# Patient Record
Sex: Male | Born: 1975 | Race: White | Hispanic: No | Marital: Single | Smoking: Former smoker
Health system: Southern US, Community
[De-identification: ages and names within clinical notes are randomized; demographics above are authoritative.]

## PROBLEM LIST (undated history)

## (undated) DIAGNOSIS — IMO0001 Reserved for inherently not codable concepts without codable children: Secondary | ICD-10-CM

## (undated) DIAGNOSIS — S0990XA Unspecified injury of head, initial encounter: Secondary | ICD-10-CM

## (undated) DIAGNOSIS — K219 Gastro-esophageal reflux disease without esophagitis: Secondary | ICD-10-CM

## (undated) DIAGNOSIS — G44309 Post-traumatic headache, unspecified, not intractable: Secondary | ICD-10-CM

## (undated) DIAGNOSIS — S0990XS Unspecified injury of head, sequela: Secondary | ICD-10-CM

---

## 2005-08-03 ENCOUNTER — Emergency Department (HOSPITAL_COMMUNITY): Admission: EM | Admit: 2005-08-03 | Discharge: 2005-08-03 | Payer: Self-pay | Admitting: Emergency Medicine

## 2012-02-11 DIAGNOSIS — S0990XA Unspecified injury of head, initial encounter: Secondary | ICD-10-CM

## 2012-02-11 HISTORY — DX: Unspecified injury of head, initial encounter: S09.90XA

## 2012-02-18 ENCOUNTER — Emergency Department (HOSPITAL_COMMUNITY)
Admission: EM | Admit: 2012-02-18 | Discharge: 2012-02-18 | Discharge status: Against Medical Advice | Payer: Self-pay | Attending: Emergency Medicine | Admitting: Emergency Medicine

## 2012-02-18 ENCOUNTER — Ambulatory Visit (HOSPITAL_COMMUNITY): Admission: RE | Admit: 2012-02-18 | Payer: Self-pay | Source: Ambulatory Visit

## 2012-02-18 ENCOUNTER — Encounter (HOSPITAL_COMMUNITY): Payer: Self-pay | Admitting: *Deleted

## 2012-02-18 DIAGNOSIS — M25519 Pain in unspecified shoulder: Secondary | ICD-10-CM | POA: Insufficient documentation
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
## 2012-02-18 NOTE — ED Notes (Signed)
Patient not in room at this time, c-collar removed by patient and on bed, patient is not able to be located in the ED at this time, Provider made aware.
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
## 2012-02-25 ENCOUNTER — Emergency Department (HOSPITAL_BASED_OUTPATIENT_CLINIC_OR_DEPARTMENT_OTHER)
Admission: EM | Admit: 2012-02-25 | Discharge: 2012-02-25 | Disposition: A | Discharge status: Home / Self Care | Payer: No Typology Code available for payment source | Attending: Emergency Medicine | Admitting: Emergency Medicine

## 2012-02-25 ENCOUNTER — Encounter (HOSPITAL_BASED_OUTPATIENT_CLINIC_OR_DEPARTMENT_OTHER): Payer: Self-pay

## 2012-02-25 ENCOUNTER — Emergency Department (HOSPITAL_BASED_OUTPATIENT_CLINIC_OR_DEPARTMENT_OTHER): Payer: Self-pay

## 2012-02-25 DIAGNOSIS — S060X9A Concussion with loss of consciousness of unspecified duration, initial encounter: Secondary | ICD-10-CM | POA: Insufficient documentation

## 2012-02-25 DIAGNOSIS — R5381 Other malaise: Secondary | ICD-10-CM | POA: Insufficient documentation

## 2012-02-25 DIAGNOSIS — R51 Headache: Secondary | ICD-10-CM | POA: Insufficient documentation

## 2012-02-25 DIAGNOSIS — S060XAA Concussion with loss of consciousness status unknown, initial encounter: Secondary | ICD-10-CM | POA: Insufficient documentation
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
## 2012-08-14 ENCOUNTER — Emergency Department (HOSPITAL_BASED_OUTPATIENT_CLINIC_OR_DEPARTMENT_OTHER): Payer: Self-pay

## 2012-08-14 ENCOUNTER — Emergency Department (HOSPITAL_BASED_OUTPATIENT_CLINIC_OR_DEPARTMENT_OTHER)
Admission: EM | Admit: 2012-08-14 | Discharge: 2012-08-14 | Disposition: A | Discharge status: Home / Self Care | Payer: Self-pay | Attending: Emergency Medicine | Admitting: Emergency Medicine

## 2012-08-14 ENCOUNTER — Encounter (HOSPITAL_BASED_OUTPATIENT_CLINIC_OR_DEPARTMENT_OTHER): Payer: Self-pay | Admitting: *Deleted

## 2012-08-14 DIAGNOSIS — R202 Paresthesia of skin: Secondary | ICD-10-CM

## 2012-08-14 DIAGNOSIS — Z87828 Personal history of other (healed) physical injury and trauma: Secondary | ICD-10-CM | POA: Insufficient documentation

## 2012-08-14 DIAGNOSIS — Z79899 Other long term (current) drug therapy: Secondary | ICD-10-CM | POA: Insufficient documentation

## 2012-08-14 DIAGNOSIS — K219 Gastro-esophageal reflux disease without esophagitis: Secondary | ICD-10-CM | POA: Insufficient documentation

## 2012-08-14 DIAGNOSIS — Z87891 Personal history of nicotine dependence: Secondary | ICD-10-CM | POA: Insufficient documentation

## 2012-08-14 DIAGNOSIS — R209 Unspecified disturbances of skin sensation: Secondary | ICD-10-CM | POA: Insufficient documentation

## 2012-08-14 HISTORY — DX: Unspecified injury of head, initial encounter: S09.90XA

## 2012-08-14 HISTORY — DX: Post-traumatic headache, unspecified, not intractable: G44.309

## 2012-08-14 HISTORY — DX: Post-traumatic headache, unspecified, not intractable: S09.90XS

## 2012-08-14 HISTORY — DX: Reserved for inherently not codable concepts without codable children: IMO0001

## 2012-08-14 HISTORY — DX: Gastro-esophageal reflux disease without esophagitis: K21.9

## 2012-08-14 LAB — URINALYSIS, ROUTINE W REFLEX MICROSCOPIC
Bilirubin Urine: NEGATIVE
Hgb urine dipstick: NEGATIVE
Protein, ur: NEGATIVE mg/dL
Specific Gravity, Urine: 1.022 (ref 1.005–1.030)
pH: 5 (ref 5.0–8.0)

## 2012-08-14 LAB — COMPREHENSIVE METABOLIC PANEL
Albumin: 3.9 g/dL (ref 3.5–5.2)
BUN: 13 mg/dL (ref 6–23)
CO2: 25 mEq/L (ref 19–32)
Calcium: 9.1 mg/dL (ref 8.4–10.5)
Creatinine, Ser: 0.8 mg/dL (ref 0.50–1.35)
Total Bilirubin: 0.2 mg/dL — ABNORMAL LOW (ref 0.3–1.2)
Total Protein: 7.6 g/dL (ref 6.0–8.3)

## 2012-08-14 LAB — CBC WITH DIFFERENTIAL/PLATELET
Eosinophils Relative: 3 % (ref 0–5)
Lymphs Abs: 2.4 10*3/uL (ref 0.7–4.0)
MCV: 85.8 fL (ref 78.0–100.0)
Monocytes Absolute: 0.7 10*3/uL (ref 0.1–1.0)
Neutro Abs: 7.5 10*3/uL (ref 1.7–7.7)
Neutrophils Relative %: 69 % (ref 43–77)

## 2012-08-14 NOTE — ED Provider Notes (Signed)
History/physical exam/procedure(s) were performed by non-physician practitioner and as supervising physician I was immediately available for consultation/collaboration. I have reviewed all notes and am in agreement with care and plan.   Hilario Quarry, MD 08/14/12 [phone]
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
## 2014-01-14 ENCOUNTER — Emergency Department (HOSPITAL_BASED_OUTPATIENT_CLINIC_OR_DEPARTMENT_OTHER): Payer: Self-pay

## 2014-01-14 ENCOUNTER — Emergency Department (HOSPITAL_BASED_OUTPATIENT_CLINIC_OR_DEPARTMENT_OTHER)
Admission: EM | Admit: 2014-01-14 | Discharge: 2014-01-15 | Disposition: A | Discharge status: Home / Self Care | Payer: Self-pay | Attending: Emergency Medicine | Admitting: Emergency Medicine

## 2014-01-14 ENCOUNTER — Encounter (HOSPITAL_BASED_OUTPATIENT_CLINIC_OR_DEPARTMENT_OTHER): Payer: Self-pay | Admitting: Emergency Medicine

## 2014-01-14 DIAGNOSIS — M79671 Pain in right foot: Secondary | ICD-10-CM

## 2014-01-14 DIAGNOSIS — Z8719 Personal history of other diseases of the digestive system: Secondary | ICD-10-CM | POA: Insufficient documentation

## 2014-01-14 DIAGNOSIS — M25579 Pain in unspecified ankle and joints of unspecified foot: Secondary | ICD-10-CM | POA: Insufficient documentation

## 2014-01-14 DIAGNOSIS — Z87828 Personal history of other (healed) physical injury and trauma: Secondary | ICD-10-CM | POA: Insufficient documentation

## 2014-01-14 DIAGNOSIS — G8921 Chronic pain due to trauma: Secondary | ICD-10-CM | POA: Insufficient documentation

## 2014-01-14 DIAGNOSIS — Z87891 Personal history of nicotine dependence: Secondary | ICD-10-CM | POA: Insufficient documentation

## 2014-01-14 DIAGNOSIS — Z880 Allergy status to penicillin: Secondary | ICD-10-CM | POA: Insufficient documentation

## 2014-01-14 DIAGNOSIS — G8929 Other chronic pain: Secondary | ICD-10-CM
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
## 2014-01-14 NOTE — ED Notes (Signed)
Patient transported to X-ray 

## 2014-01-15 MED ORDER — NAPROXEN 250 MG PO TABS
500.0000 mg | ORAL_TABLET | Freq: Once | ORAL | Status: AC
  Administered 2014-01-15: 500 mg via ORAL
  Filled 2014-01-15: qty 2

## 2014-01-15 MED ORDER — NAPROXEN 500 MG PO TABS: 500.0000 mg | ORAL_TABLET | Freq: Two times a day (BID) | ORAL | Status: AC

## 2014-01-15 NOTE — Discharge Instructions (Signed)
Your x-rays did not show the reason for your ongoing heel pain. Please follow up with the orthopedic surgeon for further evaluation. In the meantime, take naproxen twice a day. As an alternative to the prescription naproxen, you can take 2 Aleve tablets at a time, twice a day. Where the ankle splint as needed.  Naproxen and naproxen sodium oral immediate-release tablets What is this medicine? NAPROXEN (na PROX en) is a non-steroidal anti-inflammatory drug (NSAID). It is used to reduce swelling and to treat pain. This medicine may be used for dental pain, headache, or painful monthly periods. It is also used for painful joint and muscular problems such as arthritis, tendinitis, bursitis, and gout. This medicine may be used for other purposes; ask your health care provider or pharmacist if you have questions. COMMON BRAND NAME(S): Aflaxen, Aleve Arthritis, Aleve, All Day Relief, Anaprox DS, Anaprox, Naprosyn What should I tell my health care provider before I take this medicine? They need to know if you have any of these conditions: -asthma -cigarette smoker -drink more than 3 alcohol containing drinks a day -heart disease or circulation problems such as heart failure or leg edema (fluid retention) -high blood pressure -kidney disease -liver disease -stomach bleeding or ulcers -an unusual or allergic reaction to naproxen, aspirin, other NSAIDs, other medicines, foods, dyes, or preservatives -pregnant or trying to get pregnant -breast-feeding How should I use this medicine? Take this medicine by mouth with a glass of water. Follow the directions on the prescription label. Take it with food if your stomach gets upset. Try to not lie down for at least 10 minutes after you take it. Take your medicine at regular intervals. Do not take your medicine more often than directed. Long-term, continuous use may increase the risk of heart attack or stroke. A special MedGuide will be given to you by the  pharmacist with each prescription and refill. Be sure to read this information carefully each time. Talk to your pediatrician regarding the use of this medicine in children. Special care may be needed. Overdosage: If you think you have taken too much of this medicine contact a poison control center or emergency room at once. NOTE: This medicine is only for you. Do not share this medicine with others. What if I miss a dose? If you miss a dose, take it as soon as you can. If it is almost time for your next dose, take only that dose. Do not take double or extra doses. What may interact with this medicine? -alcohol -aspirin -cidofovir -diuretics -lithium -methotrexate -other drugs for inflammation like ketorolac or prednisone -pemetrexed -probenecid -warfarin This list may not describe all possible interactions. Give your health care provider a list of all the medicines, herbs, non-prescription drugs, or dietary supplements you use. Also tell them if you smoke, drink alcohol, or use illegal drugs. Some items may interact with your medicine. What should I watch for while using this medicine? Tell your doctor or health care professional if your pain does not get better. Talk to your doctor before taking another medicine for pain. Do not treat yourself. This medicine does not prevent heart attack or stroke. In fact, this medicine may increase the chance of a heart attack or stroke. The chance may increase with longer use of this medicine and in people who have heart disease. If you take aspirin to prevent heart attack or stroke, talk with your doctor or health care professional. Do not take other medicines that contain aspirin, ibuprofen, or naproxen with  this medicine. Side effects such as stomach upset, nausea, or ulcers may be more likely to occur. Many medicines available without a prescription should not be taken with this medicine. This medicine can cause ulcers and bleeding in the stomach and  intestines at any time during treatment. Do not smoke cigarettes or drink alcohol. These increase irritation to your stomach and can make it more susceptible to damage from this medicine. Ulcers and bleeding can happen without warning symptoms and can cause death. You may get drowsy or dizzy. Do not drive, use machinery, or do anything that needs mental alertness until you know how this medicine affects you. Do not stand or sit up quickly, especially if you are an older patient. This reduces the risk of dizzy or fainting spells. This medicine can cause you to bleed more easily. Try to avoid damage to your teeth and gums when you brush or floss your teeth. What side effects may I notice from receiving this medicine? Side effects that you should report to your doctor or health care professional as soon as possible: -black or bloody stools, blood in the urine or vomit -blurred vision -chest pain -difficulty breathing or wheezing -nausea or vomiting -severe stomach pain -skin rash, skin redness, blistering or peeling skin, hives, or itching -slurred speech or weakness on one side of the body -swelling of eyelids, throat, lips -unexplained weight gain or swelling -unusually weak or tired -yellowing of eyes or skin Side effects that usually do not require medical attention (report to your doctor or health care professional if they continue or are bothersome): -constipation -headache -heartburn This list may not describe all possible side effects. Call your doctor for medical advice about side effects. You may report side effects to FDA at 1-800-FDA-1088. Where should I keep my medicine? Keep out of the reach of children. Store at room temperature between 15 and 30 degrees C (59 and 86 degrees F). Keep container tightly closed. Throw away any unused medicine after the expiration date. NOTE: This sheet is a summary. It may not cover all possible information. If you have questions about this  medicine, talk to your doctor, pharmacist, or health care provider.  2014, Elsevier/Gold Standard. (2009-08-01 20:10:16)

## 2014-04-20 ENCOUNTER — Ambulatory Visit (INDEPENDENT_AMBULATORY_CARE_PROVIDER_SITE_OTHER): Payer: Self-pay | Admitting: Family Medicine

## 2014-04-20 VITALS — BP 140/84 | HR 66 | Temp 98.4°F | Resp 16 | Ht 69.5 in | Wt 368.8 lb

## 2014-04-20 DIAGNOSIS — Z0289 Encounter for other administrative examinations: Secondary | ICD-10-CM

## 2014-04-20 DIAGNOSIS — Z87820 Personal history of traumatic brain injury: Secondary | ICD-10-CM
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
## 2015-04-16 ENCOUNTER — Emergency Department (HOSPITAL_BASED_OUTPATIENT_CLINIC_OR_DEPARTMENT_OTHER): Payer: Self-pay

## 2015-04-16 ENCOUNTER — Emergency Department (HOSPITAL_BASED_OUTPATIENT_CLINIC_OR_DEPARTMENT_OTHER)
Admission: EM | Admit: 2015-04-16 | Discharge: 2015-04-16 | Disposition: A | Discharge status: Home / Self Care | Payer: Self-pay | Attending: Emergency Medicine | Admitting: Emergency Medicine

## 2015-04-16 ENCOUNTER — Encounter (HOSPITAL_BASED_OUTPATIENT_CLINIC_OR_DEPARTMENT_OTHER): Payer: Self-pay | Admitting: Emergency Medicine

## 2015-04-16 DIAGNOSIS — M546 Pain in thoracic spine: Secondary | ICD-10-CM | POA: Insufficient documentation

## 2015-04-16 DIAGNOSIS — R110 Nausea: Secondary | ICD-10-CM | POA: Insufficient documentation

## 2015-04-16 DIAGNOSIS — R52 Pain, unspecified: Secondary | ICD-10-CM

## 2015-04-16 DIAGNOSIS — Z8719 Personal history of other diseases of the digestive system: Secondary | ICD-10-CM | POA: Insufficient documentation

## 2015-04-16 DIAGNOSIS — Z8669 Personal history of other diseases of the nervous system and sense organs: Secondary | ICD-10-CM | POA: Insufficient documentation

## 2015-04-16 DIAGNOSIS — Z87828 Personal history of other (healed) physical injury and trauma: Secondary | ICD-10-CM | POA: Insufficient documentation

## 2015-04-16 DIAGNOSIS — Z720 Tobacco use: Secondary | ICD-10-CM | POA: Insufficient documentation

## 2015-04-16 DIAGNOSIS — Z880 Allergy status to penicillin: Secondary | ICD-10-CM | POA: Insufficient documentation

## 2015-04-16 DIAGNOSIS — Z791 Long term (current) use of non-steroidal anti-inflammatories (NSAID): Secondary | ICD-10-CM | POA: Insufficient documentation

## 2015-04-16 LAB — URINALYSIS, ROUTINE W REFLEX MICROSCOPIC
BILIRUBIN URINE: NEGATIVE
Glucose, UA: NEGATIVE mg/dL
Hgb urine dipstick: NEGATIVE
KETONES UR: NEGATIVE mg/dL
Leukocytes, UA: NEGATIVE
NITRITE: NEGATIVE
PROTEIN: NEGATIVE mg/dL
SPECIFIC GRAVITY, URINE: 1.039 — AB (ref 1.005–1.030)
UROBILINOGEN UA: 0.2 mg/dL (ref 0.0–1.0)
pH: 5 (ref 5.0–8.0)

## 2015-04-16 MED ORDER — IBUPROFEN 800 MG PO TABS: 800.0000 mg | ORAL_TABLET | Freq: Three times a day (TID) | ORAL | Status: AC | PRN

## 2015-04-16 MED ORDER — KETOROLAC TROMETHAMINE 60 MG/2ML IM SOLN
60.0000 mg | Freq: Once | INTRAMUSCULAR | Status: AC
  Administered 2015-04-16: 60 mg via INTRAMUSCULAR
  Filled 2015-04-16: qty 2

## 2015-04-16 MED ORDER — HYDROCODONE-ACETAMINOPHEN 5-325 MG PO TABS: 1.0000 | ORAL_TABLET | ORAL | Status: AC | PRN

## 2015-04-16 MED ORDER — CYCLOBENZAPRINE HCL 10 MG PO TABS: 10.0000 mg | ORAL_TABLET | Freq: Three times a day (TID) | ORAL | Status: AC | PRN

## 2015-04-16 MED ORDER — ONDANSETRON 8 MG PO TBDP
8.0000 mg | ORAL_TABLET | Freq: Once | ORAL | Status: AC
  Administered 2015-04-16: 8 mg via ORAL
  Filled 2015-04-16: qty 1
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
## 2018-05-24 ENCOUNTER — Emergency Department (HOSPITAL_BASED_OUTPATIENT_CLINIC_OR_DEPARTMENT_OTHER): Payer: Self-pay

## 2018-05-24 ENCOUNTER — Other Ambulatory Visit: Payer: Self-pay

## 2018-05-24 ENCOUNTER — Emergency Department (HOSPITAL_BASED_OUTPATIENT_CLINIC_OR_DEPARTMENT_OTHER)
Admission: EM | Admit: 2018-05-24 | Discharge: 2018-05-25 | Disposition: A | Discharge status: Home / Self Care | Payer: Self-pay | Attending: Emergency Medicine | Admitting: Emergency Medicine

## 2018-05-24 ENCOUNTER — Encounter (HOSPITAL_BASED_OUTPATIENT_CLINIC_OR_DEPARTMENT_OTHER): Payer: Self-pay

## 2018-05-24 DIAGNOSIS — R5383 Other fatigue: Secondary | ICD-10-CM | POA: Insufficient documentation

## 2018-05-24 DIAGNOSIS — K648 Other hemorrhoids: Secondary | ICD-10-CM | POA: Insufficient documentation

## 2018-05-24 DIAGNOSIS — K625 Hemorrhage of anus and rectum: Secondary | ICD-10-CM

## 2018-05-24 DIAGNOSIS — Z87891 Personal history of nicotine dependence: Secondary | ICD-10-CM | POA: Insufficient documentation

## 2018-05-24 NOTE — ED Triage Notes (Addendum)
Pt c/o active rectal bleeding for "a few days" Pt reports passing blood clots.

## 2018-05-25 LAB — COMPREHENSIVE METABOLIC PANEL
ALBUMIN: 4.1 g/dL (ref 3.5–5.0)
ALK PHOS: 57 U/L (ref 38–126)
ALT: 16 U/L (ref 0–44)
ANION GAP: 7 (ref 5–15)
AST: 15 U/L (ref 15–41)
BUN: 18 mg/dL (ref 6–20)
CHLORIDE: 107 mmol/L (ref 98–111)
CO2: 26 mmol/L (ref 22–32)
Calcium: 8.9 mg/dL (ref 8.9–10.3)
Creatinine, Ser: 0.77 mg/dL (ref 0.61–1.24)
GFR calc Af Amer: >60 mL/min (ref >60)
GFR calc non Af Amer: >60 mL/min (ref >60)
Glucose, Bld: 100 mg/dL — ABNORMAL HIGH (ref 70–99)
POTASSIUM: 4.3 mmol/L (ref 3.5–5.1)
SODIUM: 140 mmol/L (ref 135–145)
Total Bilirubin: 0.3 mg/dL (ref 0.3–1.2)
Total Protein: 7.5 g/dL (ref 6.5–8.1)

## 2018-05-25 LAB — DIFFERENTIAL
BASOS ABS: 0.1 10*3/uL (ref 0.0–0.1)
BASOS PCT: 1 %
EOS ABS: 0.3 10*3/uL (ref 0.0–0.5)
Eosinophils Relative: 3 %
LYMPHS ABS: 3.2 10*3/uL (ref 0.7–4.0)
Lymphocytes Relative: 29 %
MONOS PCT: 7 %
Monocytes Absolute: 0.8 10*3/uL (ref 0.1–1.0)
NEUTROS ABS: 6.8 10*3/uL (ref 1.7–7.7)
NEUTROS PCT: 61 %

## 2018-05-25 LAB — CBC WITH DIFFERENTIAL/PLATELET
ABS IMMATURE GRANULOCYTES: 0.05 10*3/uL (ref 0.00–0.07)
BASOS ABS: 0.1 10*3/uL (ref 0.0–0.1)
BASOS PCT: 1 %
EOS ABS: 0.3 10*3/uL (ref 0.0–0.5)
Eosinophils Relative: 3 %
HCT: 45.2 % (ref 39.0–52.0)
Hemoglobin: 15.3 g/dL (ref 13.0–17.0)
IMMATURE GRANULOCYTES: 1 %
LYMPHS ABS: 2.9 10*3/uL (ref 0.7–4.0)
Lymphocytes Relative: 29 %
MCH: 30 pg (ref 26.0–34.0)
MCHC: 33.8 g/dL (ref 30.0–36.0)
MCV: 88.6 fL (ref 80.0–100.0)
Monocytes Absolute: 0.7 10*3/uL (ref 0.1–1.0)
Monocytes Relative: 7 %
NEUTROS ABS: 6 10*3/uL (ref 1.7–7.7)
NEUTROS PCT: 59 %
NRBC: 0 % (ref 0.0–0.2)
PLATELETS: 300 10*3/uL (ref 150–400)
RBC: 5.1 MIL/uL (ref 4.22–5.81)
RDW: 12 % (ref 11.5–15.5)
WBC: 10 10*3/uL (ref 4.0–10.5)

## 2018-05-25 LAB — CBC
HEMATOCRIT: 43.9 % (ref 39.0–52.0)
HEMOGLOBIN: 15.7 g/dL (ref 13.0–17.0)
MCH: 32.3 pg (ref 26.0–34.0)
MCHC: 35.8 g/dL (ref 30.0–36.0)
MCV: 90.3 fL (ref 80.0–100.0)
Platelets: 311 10*3/uL (ref 150–400)
RBC: 4.86 MIL/uL (ref 4.22–5.81)
RDW: 12.5 % (ref 11.5–15.5)
WBC: 10.9 10*3/uL — ABNORMAL HIGH (ref 4.0–10.5)
nRBC: 0 % (ref 0.0–0.2)

## 2018-05-25 LAB — PROTIME-INR
INR: 0.97
Prothrombin Time: 12.8 seconds (ref 11.4–15.2)

## 2018-05-25 MED ORDER — DOCUSATE SODIUM 250 MG PO CAPS: 250.0000 mg | ORAL_CAPSULE | Freq: Every day | ORAL | 0 refills | Status: DC

## 2018-05-25 MED ORDER — IOPAMIDOL (ISOVUE-300) INJECTION 61%
100.0000 mL | Freq: Once | INTRAVENOUS | Status: AC | PRN
  Administered 2018-05-25: 100 mL via INTRAVENOUS

## 2018-05-25 MED ORDER — HYDROCORTISONE ACETATE 25 MG RE SUPP: 25.0000 mg | Freq: Two times a day (BID) | RECTAL | 0 refills | Status: AC

## 2018-05-25 MED ORDER — HYDROCORTISONE ACETATE 25 MG RE SUPP: 25.0000 mg | Freq: Two times a day (BID) | RECTAL | 0 refills | Status: DC

## 2018-05-25 MED ORDER — DOCUSATE SODIUM 250 MG PO CAPS: 250.0000 mg | ORAL_CAPSULE | Freq: Every day | ORAL | 0 refills | Status: AC

## 2018-05-25 NOTE — Discharge Instructions (Addendum)
To help your hemorrhoids:  Start taking a stool softener daily. I've prescribed a 14-day course of one.  Use the suppositories twice a day for 5-7 days.  Try to increase your dietary fiber - you can take over-the-counter supplements as well  Try not to strain while having a bowel movement  Sitz baths can also help with bleeding and pain  It's important to follow-up with a GI doctor, as above.  IF BLEEDING DOES NOT IMPROVE OR IF YOU DEVELOP CHEST PAIN, SHORTNESS OF BREATH, LIGHTHEADEDNESS, OR ANY OTHER CONCERNING SYMPTOMS, RETURN TO THE ER IMMEDIATELY
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
## 2020-05-22 IMAGING — CT CT ABD-PELV W/ CM
2 of 5 series · 17 of 46 positions shown, 19 images · IV contrast (APPLIED)
Comparison: CT the abdomen and pelvis 08/31/2016.

CLINICAL DATA: 42-year-old male with history of blood in the stool
for the past 4 days.

EXAM:
CT ABDOMEN AND PELVIS WITH CONTRAST
TECHNIQUE: Multidetector CT imaging of the abdomen and pelvis was performed
using the standard protocol following bolus administration of
intravenous contrast.
CONTRAST:  100mL H1WZAT-CEE IOPAMIDOL (H1WZAT-CEE) INJECTION 61%

[Series 2: axial st · axial · 0.98mm/px · z∈[-551,-131]mm · 14 of 98 slices shown, 16 images]
[im 7/98  soft-tissue]
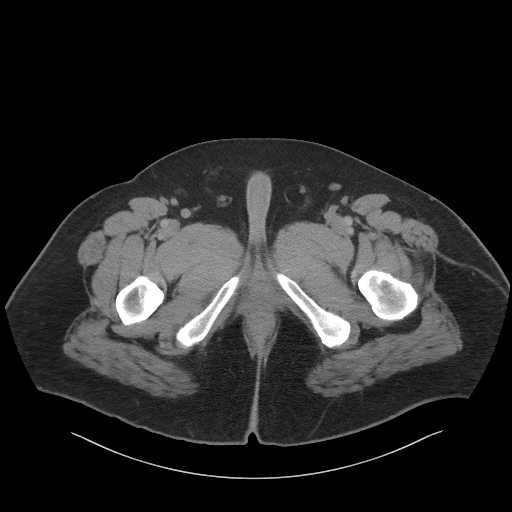
[im 7/98  bone]
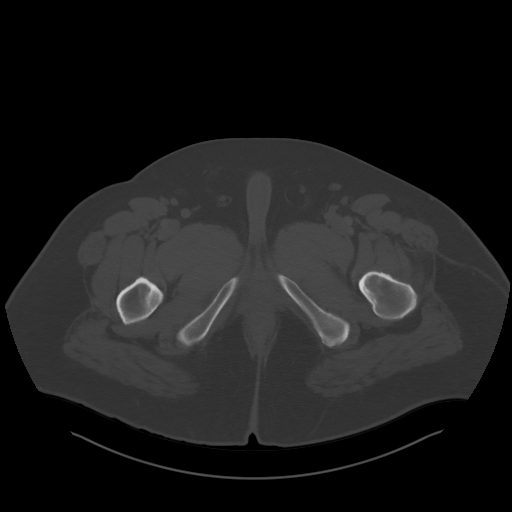
[im 13/98  soft-tissue]
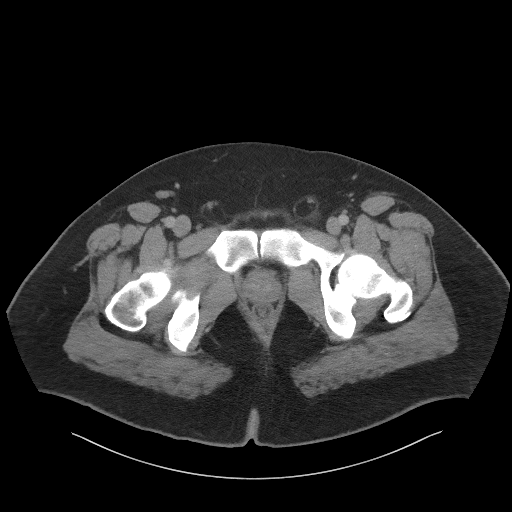
[im 19/98  soft-tissue]
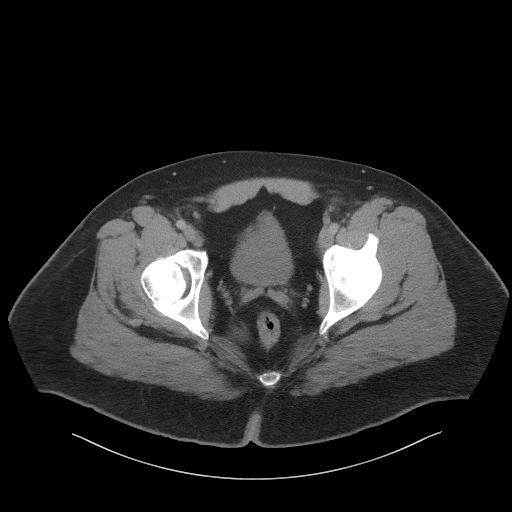
[im 25/98  soft-tissue]
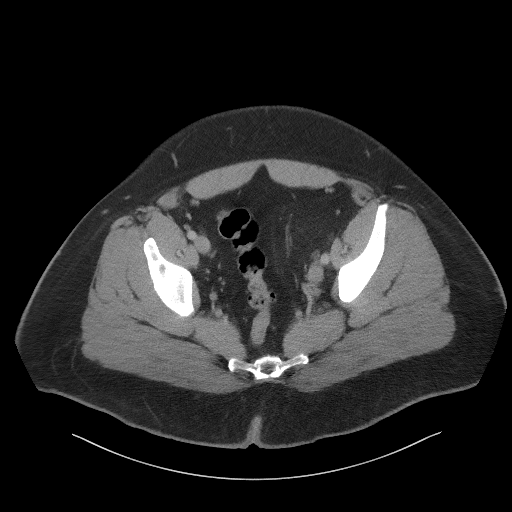
[im 31/98  soft-tissue]
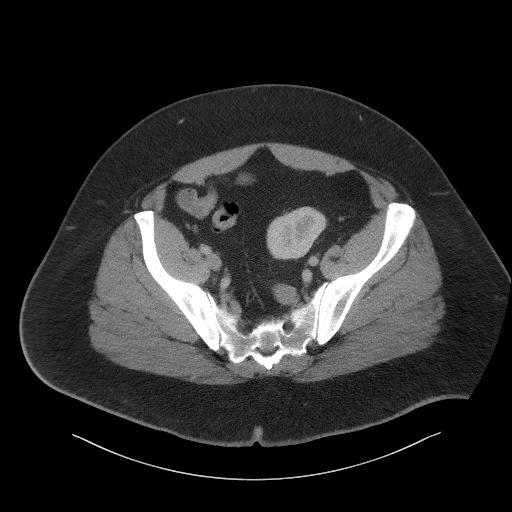
[im 37/98  soft-tissue]
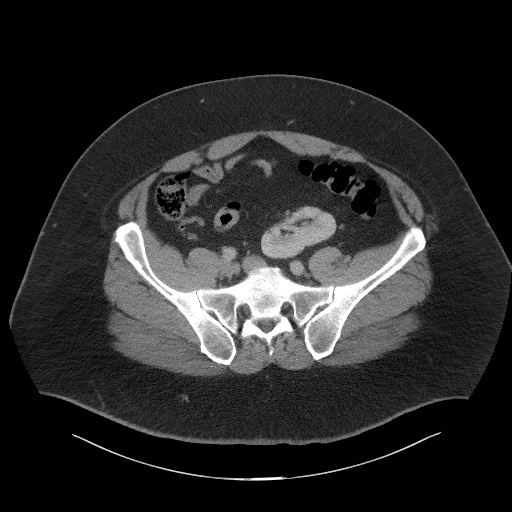
[im 43/98  soft-tissue]
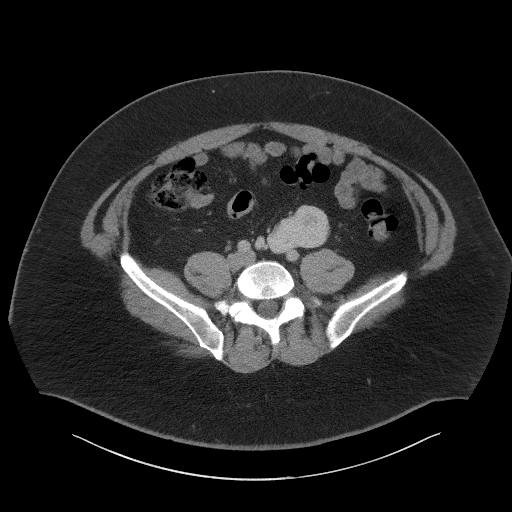
[im 55/98  soft-tissue]
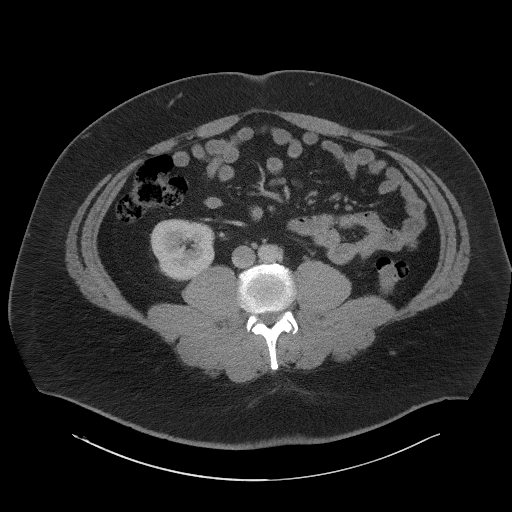
[im 61/98  soft-tissue]
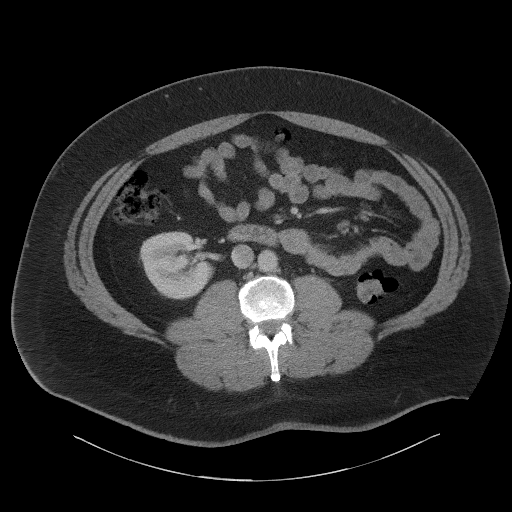
[im 61/98  bone]
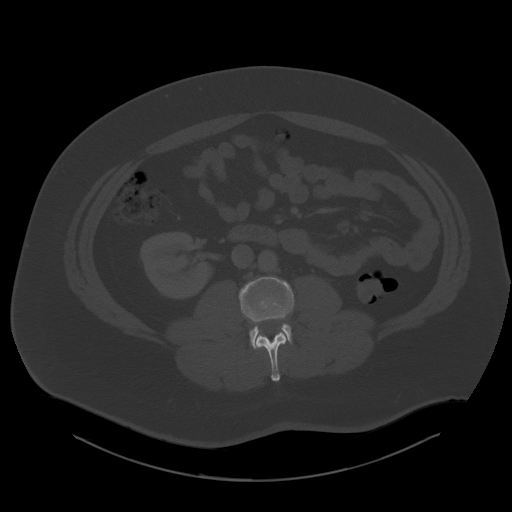
[im 67/98  soft-tissue]
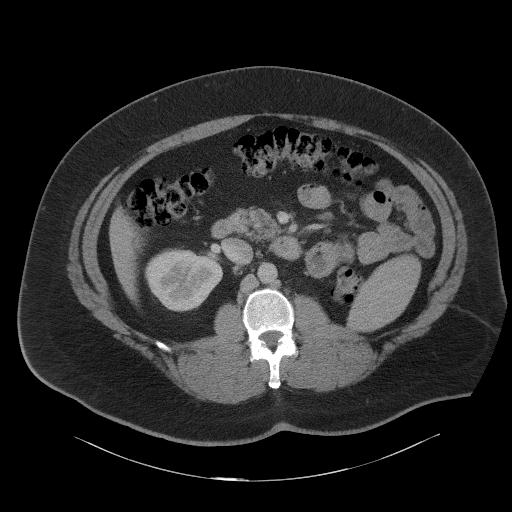
[im 73/98  soft-tissue]
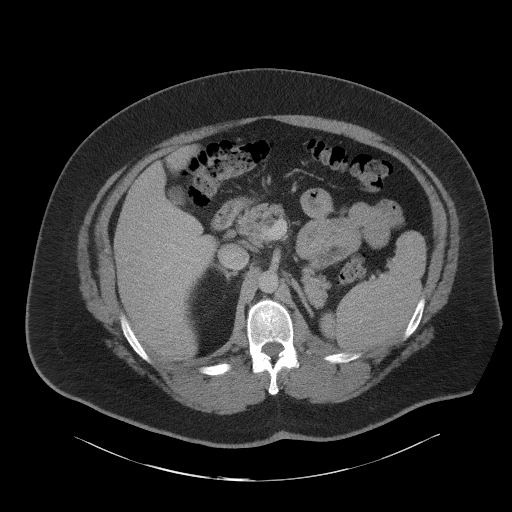
[im 79/98  soft-tissue]
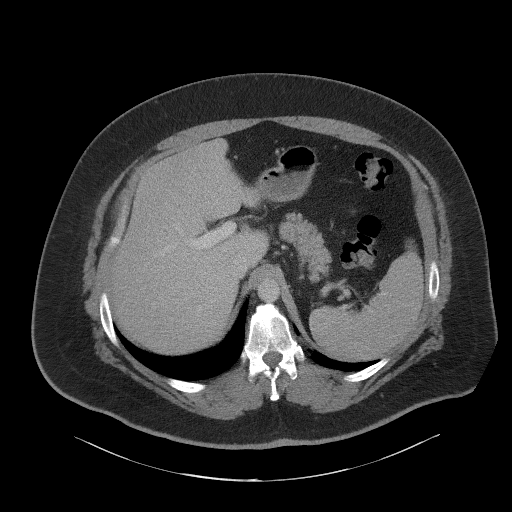
[im 85/98  soft-tissue]
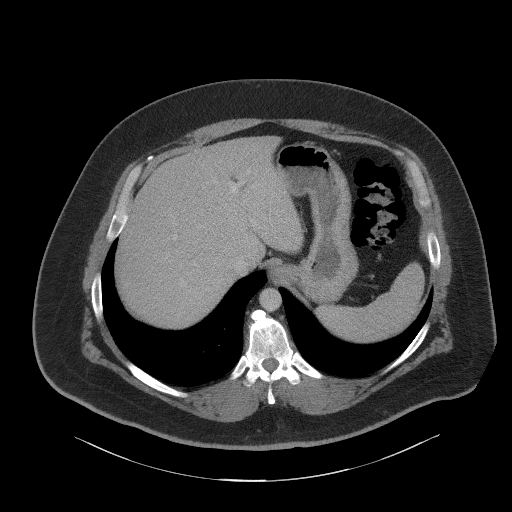
[im 91/98  soft-tissue]
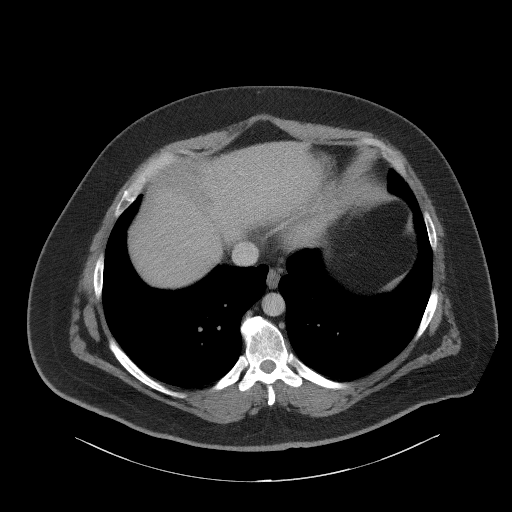

[Series 5: coronal st · coronal · 0.99mm/px · 3 of 127 slices shown]
[im 43/127  soft-tissue]
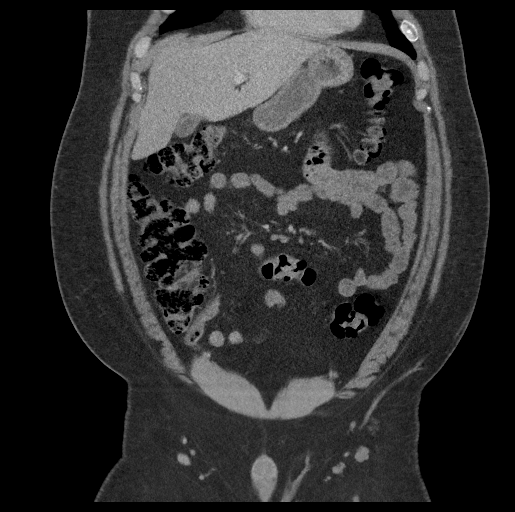
[im 57/127  soft-tissue]
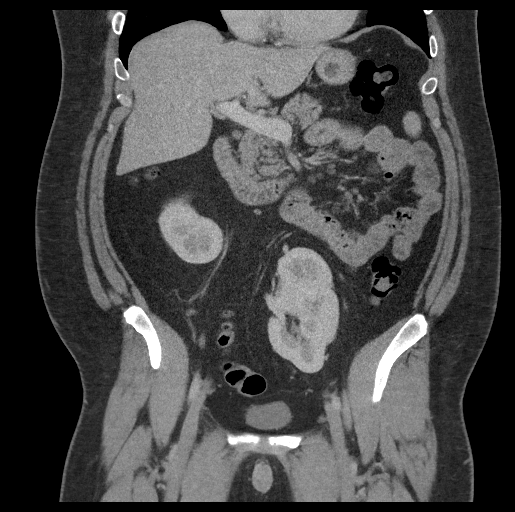
[im 71/127  soft-tissue]
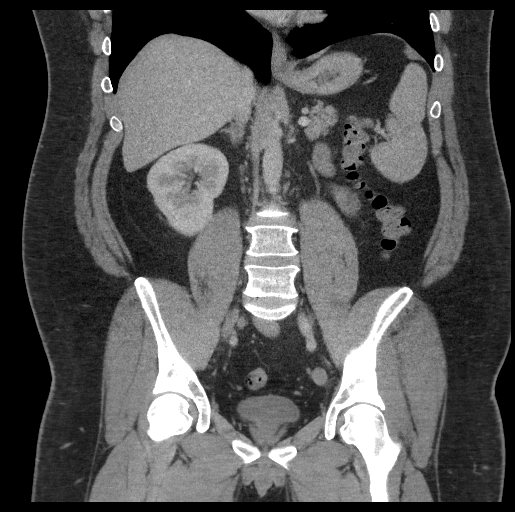

[17 of 46 positions shown; findings below may reference images not displayed]

FINDINGS: Lower chest: Unremarkable.

Hepatobiliary: No suspicious cystic or solid hepatic lesions. No
intra or extrahepatic biliary ductal dilatation. Gallbladder is
normal in appearance.

Pancreas: No pancreatic mass. No pancreatic ductal dilatation. No
pancreatic or peripancreatic fluid or inflammatory changes.

Spleen: Unremarkable.

Adrenals/Urinary Tract: Left-sided pelvic kidney again noted. Right
kidney and bilateral adrenal glands are normal in appearance. No
hydroureteronephrosis. Urinary bladder is normal in appearance.

Stomach/Bowel: Normal appearance of the stomach. No pathologic
dilatation of small bowel or colon. Normal appendix.

Vascular/Lymphatic: No significant atherosclerotic disease, aneurysm
or dissection noted in the abdominal or pelvic vasculature. No
lymphadenopathy noted in the abdomen or pelvis.

Reproductive: Prostate gland and seminal vesicles are unremarkable
in appearance.

Other: No significant volume of ascites.  No pneumoperitoneum.

Musculoskeletal: There are no aggressive appearing lytic or blastic
lesions noted in the visualized portions of the skeleton.
IMPRESSION: 1. No acute findings are noted in the abdomen or pelvis.
2. Left-sided pelvic kidney (normal variant) again incidentally
noted.
